# Patient Record
Sex: Female | Born: 1960 | Race: Black or African American | Hispanic: No | Marital: Married | Smoking: Former smoker
Health system: Southern US, Community
[De-identification: ages and names within clinical notes are randomized; demographics above are authoritative.]

## PROBLEM LIST (undated history)

## (undated) DIAGNOSIS — Z8781 Personal history of (healed) traumatic fracture: Secondary | ICD-10-CM

## (undated) DIAGNOSIS — R079 Chest pain, unspecified: Secondary | ICD-10-CM

## (undated) DIAGNOSIS — I499 Cardiac arrhythmia, unspecified: Secondary | ICD-10-CM

## (undated) DIAGNOSIS — H9313 Tinnitus, bilateral: Secondary | ICD-10-CM

## (undated) DIAGNOSIS — N63 Unspecified lump in unspecified breast: Secondary | ICD-10-CM

## (undated) HISTORY — DX: Tinnitus, bilateral: H93.13

## (undated) HISTORY — PX: BUNIONECTOMY: SHX129

## (undated) HISTORY — DX: Personal history of (healed) traumatic fracture: Z87.81

## (undated) HISTORY — DX: Chest pain, unspecified: R07.9

## (undated) HISTORY — DX: Unspecified lump in unspecified breast: N63.0

## (undated) HISTORY — DX: Cardiac arrhythmia, unspecified: I49.9

---

## 1998-12-29 ENCOUNTER — Encounter: Payer: Self-pay | Admitting: *Deleted

## 1998-12-29 ENCOUNTER — Ambulatory Visit (HOSPITAL_COMMUNITY): Admission: RE | Admit: 1998-12-29 | Discharge: 1998-12-29 | Payer: Self-pay | Admitting: *Deleted

## 1999-01-01 ENCOUNTER — Ambulatory Visit (HOSPITAL_COMMUNITY): Admission: RE | Admit: 1999-01-01 | Discharge: 1999-01-01 | Payer: Self-pay | Admitting: *Deleted

## 1999-01-01 ENCOUNTER — Encounter: Payer: Self-pay | Admitting: *Deleted

## 2000-08-01 ENCOUNTER — Other Ambulatory Visit: Admission: RE | Admit: 2000-08-01 | Discharge: 2000-08-01 | Payer: Self-pay | Admitting: Obstetrics and Gynecology

## 2000-08-16 ENCOUNTER — Encounter: Payer: Self-pay | Admitting: Obstetrics and Gynecology

## 2000-08-16 ENCOUNTER — Encounter: Admission: RE | Admit: 2000-08-16 | Discharge: 2000-08-16 | Payer: Self-pay | Admitting: Obstetrics and Gynecology

## 2001-03-21 HISTORY — PX: ANKLE SURGERY: SHX546

## 2001-10-11 ENCOUNTER — Other Ambulatory Visit: Admission: RE | Admit: 2001-10-11 | Discharge: 2001-10-11 | Payer: Self-pay | Admitting: Obstetrics and Gynecology

## 2001-12-04 ENCOUNTER — Encounter: Payer: Self-pay | Admitting: Obstetrics and Gynecology

## 2001-12-04 ENCOUNTER — Encounter: Admission: RE | Admit: 2001-12-04 | Discharge: 2001-12-04 | Payer: Self-pay | Admitting: Obstetrics and Gynecology

## 2002-05-18 ENCOUNTER — Encounter: Payer: Self-pay | Admitting: Emergency Medicine

## 2002-05-18 ENCOUNTER — Emergency Department (HOSPITAL_COMMUNITY): Admission: EM | Admit: 2002-05-18 | Discharge: 2002-05-18 | Payer: Self-pay | Admitting: Emergency Medicine

## 2002-05-23 ENCOUNTER — Ambulatory Visit (HOSPITAL_COMMUNITY): Admission: RE | Admit: 2002-05-23 | Discharge: 2002-05-24 | Payer: Self-pay | Admitting: Orthopedic Surgery

## 2003-02-17 ENCOUNTER — Encounter: Admission: RE | Admit: 2003-02-17 | Discharge: 2003-02-17 | Payer: Self-pay | Admitting: Obstetrics and Gynecology

## 2004-05-19 ENCOUNTER — Encounter: Admission: RE | Admit: 2004-05-19 | Discharge: 2004-05-19 | Payer: Self-pay | Admitting: Obstetrics and Gynecology

## 2004-07-28 ENCOUNTER — Other Ambulatory Visit: Admission: RE | Admit: 2004-07-28 | Discharge: 2004-07-28 | Payer: Self-pay | Admitting: Obstetrics and Gynecology

## 2004-08-11 ENCOUNTER — Encounter: Admission: RE | Admit: 2004-08-11 | Discharge: 2004-08-11 | Payer: Self-pay | Admitting: Obstetrics and Gynecology

## 2005-09-22 ENCOUNTER — Other Ambulatory Visit: Admission: RE | Admit: 2005-09-22 | Discharge: 2005-09-22 | Payer: Self-pay | Admitting: Obstetrics and Gynecology

## 2005-09-29 ENCOUNTER — Encounter: Admission: RE | Admit: 2005-09-29 | Discharge: 2005-09-29 | Payer: Self-pay | Admitting: Obstetrics and Gynecology

## 2006-02-03 ENCOUNTER — Encounter: Admission: RE | Admit: 2006-02-03 | Discharge: 2006-02-03 | Payer: Self-pay | Admitting: Obstetrics and Gynecology

## 2006-10-24 ENCOUNTER — Encounter: Admission: RE | Admit: 2006-10-24 | Discharge: 2006-10-24 | Payer: Self-pay | Admitting: Obstetrics and Gynecology

## 2007-11-21 ENCOUNTER — Encounter: Admission: RE | Admit: 2007-11-21 | Discharge: 2007-11-21 | Payer: Self-pay | Admitting: Obstetrics and Gynecology

## 2008-12-23 ENCOUNTER — Encounter: Admission: RE | Admit: 2008-12-23 | Discharge: 2008-12-23 | Payer: Self-pay | Admitting: Obstetrics and Gynecology

## 2010-02-12 ENCOUNTER — Encounter: Admission: RE | Admit: 2010-02-12 | Discharge: 2010-02-12 | Payer: Self-pay | Admitting: Obstetrics and Gynecology

## 2010-04-12 ENCOUNTER — Encounter: Payer: Self-pay | Admitting: Obstetrics and Gynecology

## 2010-08-06 NOTE — Op Note (Signed)
[name], Kristen Castaneda                           ACCOUNT NO.:  [id number]   MEDICAL RECORD NO.:  [id number]                   PATIENT TYPE:  OIB   LOCATION:  5013                                 FACILITY:  [facility]   PHYSICIAN:  Nadara Mustard, M.D.                DATE OF BIRTH:  11/05/1960   DATE OF PROCEDURE:  05/23/2002  DATE OF DISCHARGE:                                 OPERATIVE REPORT   PREOPERATIVE DIAGNOSIS:  Right ankle Weber B fibular fracture.   POSTOPERATIVE DIAGNOSIS:  Right ankle Weber B fibular fracture.   OPERATION PERFORMED:  Open reduction internal fixation right distal fibula.   SURGEON:  Nadara Mustard, M.D.   ANESTHESIA:  LMA.   ESTIMATED BLOOD LOSS:  Minimal.   ANTIBIOTICS:  1 g Kefzol.   TOURNIQUET TIME:  None.   DISPOSITION:  To PACU in stable condition, plan for 23-hour observation.   INDICATIONS FOR PROCEDURE:  The patient is a 50 year old woman who slipped  and fell on the ice and sustained a Weber B right fibular fracture  displaced.  The patient visited the office.  She did have a displacement of  the mortise and discussed the risks and benefits of closed versus surgical  treatment and the patient wished to proceed with surgical intervention. The  risks and benefits were discussed.  The patient states she understands and  wants to proceed at this time.   DESCRIPTION OF PROCEDURE:  The patient was brought to operating room #4 and  underwent a general LMA anesthetic.  After an adequate level of anesthesia  was obtained, the patient's right lower extremity was prepped using DuraPrep  and draped into a sterile field.  Collier Flowers was used to cover all exposed skin.  A lateral incision was made over the fibula.  This was carried down to the  fracture site.  Dissection was performed to cleanse the fracture site.  This  was reduced and a lag screw was used to stabilize the fracture site.  A  neutralization peg was then placed laterally and this was secured  with a six-  hole one third tubular locking plate.  C-arm fluoroscopy was used to verify  reduction in both AP and lateral planes.  Hard copy images were obtained and  this was reviewed and had good stable alignment.  The wound was irrigated.  The subcu was closed using 2-0 Vicryl.  The skin was closed using Proximate  staples.  The wound was covered with Adaptic orthopedic sponges, sterile  Webril and a Coban dressing.  The patient will be placed in a fracture boot  nonweightbearing, plan for 23 hour observation.  Discharge in the morning.  Nadara Mustard, M.D.   MVD/MEDQ  D:  05/23/2002  T:  05/23/2002  Job:  [id number]

## 2011-06-20 ENCOUNTER — Other Ambulatory Visit: Payer: Self-pay | Admitting: Obstetrics and Gynecology

## 2011-08-02 ENCOUNTER — Encounter: Payer: Self-pay | Admitting: Obstetrics and Gynecology

## 2012-05-03 ENCOUNTER — Ambulatory Visit: Payer: Self-pay | Admitting: Obstetrics and Gynecology

## 2012-05-03 ENCOUNTER — Encounter: Payer: Self-pay | Admitting: Obstetrics and Gynecology

## 2012-05-07 ENCOUNTER — Encounter: Payer: Self-pay | Admitting: Obstetrics and Gynecology

## 2012-05-07 ENCOUNTER — Ambulatory Visit: Payer: BC Managed Care – PPO | Admitting: Obstetrics and Gynecology

## 2012-05-07 VITALS — BP 114/70 | Resp 14 | Ht 62.0 in | Wt 163.0 lb

## 2012-05-07 DIAGNOSIS — Z309 Encounter for contraceptive management, unspecified: Secondary | ICD-10-CM

## 2012-05-07 DIAGNOSIS — Z124 Encounter for screening for malignant neoplasm of cervix: Secondary | ICD-10-CM

## 2012-05-07 DIAGNOSIS — Z01419 Encounter for gynecological examination (general) (routine) without abnormal findings: Secondary | ICD-10-CM

## 2012-05-07 LAB — POCT URINE PREGNANCY: Preg Test, Ur: NEGATIVE

## 2012-05-07 MED ORDER — NORETHIN ACE-ETH ESTRAD-FE 1-20 MG-MCG(24) PO TABS: 1.0000 | ORAL_TABLET | Freq: Every day | ORAL | Status: AC

## 2012-05-07 NOTE — Progress Notes (Signed)
Subjective:    Kristen Castaneda is a 52 y.o. female, G5P0, who presents for an annual exam. The patient reports no complaints.  Menstrual cycle:   LMP: Patient's last menstrual period was 02/05/2012.             Review of Systems Pertinent items are noted in HPI. Denies pelvic pain, urinary tract symptoms, vaginitis symptoms, irregular bleeding, menopausal symptoms, change in bowel habits or rectal bleeding   Objective:    BP 114/70  Resp 14  Ht 5\' 2"  (1.575 m)  LMP 02/05/2012   Wt Readings from Last 1 Encounters:  No data found for Wt   There is no weight on file to calculate BMI. General Appearance: Alert, no acute distress HEENT: Grossly normal Neck / Thyroid: Supple, no thyromegaly or cervical adenopathy Lungs: Clear to auscultation bilaterally Back: No CVA tenderness Breast Exam: No masses or nodes.No dimpling, nipple retraction or discharge. Cardiovascular: Regular rate and rhythm.  Gastrointestinal: Soft, non-tender, no masses or organomegaly Pelvic Exam: EGBUS-wnl, vagina-normal rugae, cervix- without lesions or tenderness, uterus appears normal size shape and consistency, adnexae-no masses or tenderness Rectovaginal: no masses and normal sphincter tone Lymphatic Exam: Non-palpable nodes in neck, clavicular,  axillary, or inguinal regions  Skin: no rashes or abnormalities Extremities: no clubbing cyanosis or edema  Neurologic: grossly normal Psychiatric: Alert and oriented  UPT: negative   Assessment:   Routine GYN Exam   Plan:  Lo Loestrin 24 Fe  #1 1 po qd 11 refills PAP sent RTO 1 year or prn  Ashunti Schofield,ELMIRAPA-C

## 2012-05-07 NOTE — Progress Notes (Signed)
Regular Periods: no Mammogram: yes  Monthly Breast Ex.: yes Exercise: no  Tetanus < 10 years: yes Seatbelts: yes  NI. Bladder Functn.: yes Abuse at home: no  Daily BM's: yes Stressful Work: yes  Healthy Diet: yes Sigmoid-Colonoscopy: 2012 ?  Calcium: no Medical problems this year: none   LAST PAP:2/13  Contraception: loestrin 24 fe  Mammogram:  2011  PCP: Dr. Nehemiah Settle  PMH: no change  FMH:  No change   Last Bone Scan: no  Pt is married.

## 2012-05-08 LAB — PAP IG W/ RFLX HPV ASCU

## 2012-11-08 ENCOUNTER — Other Ambulatory Visit: Payer: Self-pay

## 2012-11-08 DIAGNOSIS — Z1231 Encounter for screening mammogram for malignant neoplasm of breast: Secondary | ICD-10-CM

## 2012-11-12 ENCOUNTER — Ambulatory Visit
Admission: RE | Admit: 2012-11-12 | Discharge: 2012-11-12 | Disposition: A | Discharge status: Home / Self Care | Payer: BC Managed Care – PPO | Source: Ambulatory Visit

## 2012-11-12 DIAGNOSIS — Z1231 Encounter for screening mammogram for malignant neoplasm of breast: Secondary | ICD-10-CM

## 2012-11-16 ENCOUNTER — Other Ambulatory Visit: Payer: Self-pay | Admitting: Internal Medicine

## 2012-11-16 DIAGNOSIS — R209 Unspecified disturbances of skin sensation: Secondary | ICD-10-CM

## 2012-11-21 ENCOUNTER — Ambulatory Visit
Admission: RE | Admit: 2012-11-21 | Discharge: 2012-11-21 | Disposition: A | Discharge status: Home / Self Care | Payer: BC Managed Care – PPO | Source: Ambulatory Visit | Attending: Internal Medicine | Admitting: Internal Medicine

## 2012-11-21 DIAGNOSIS — R209 Unspecified disturbances of skin sensation: Secondary | ICD-10-CM

## 2012-12-04 ENCOUNTER — Encounter: Payer: Self-pay | Admitting: Neurology

## 2012-12-05 ENCOUNTER — Encounter: Payer: Self-pay | Admitting: Neurology

## 2012-12-05 ENCOUNTER — Ambulatory Visit (INDEPENDENT_AMBULATORY_CARE_PROVIDER_SITE_OTHER): Payer: BC Managed Care – PPO | Admitting: Neurology

## 2012-12-05 VITALS — BP 123/78 | HR 79 | Ht 62.0 in | Wt 163.0 lb

## 2012-12-05 DIAGNOSIS — R200 Anesthesia of skin: Secondary | ICD-10-CM

## 2012-12-05 DIAGNOSIS — R209 Unspecified disturbances of skin sensation: Secondary | ICD-10-CM

## 2012-12-05 NOTE — Progress Notes (Signed)
[facility]  PATIENT: Kristen Castaneda DOB: [birth year]  HISTORICAL  Mrs. Kristen Castaneda is a 52 yo RH AAF, was referred by her primary care physician Dr. Nehemiah Castaneda for evaluation of left skull numbness  She was previously healthy, since July fourth, she noticed left skull area numbness, only noticeable when she touch her skull, involving overlapping areas between left V1 and left great occipital nerve territory, she denies pain, no headaches, no rash broke out, it went away since September 10th 2014.  She denies visual change, no dysarthria, no dysphagia,  She reported normal laboratory evaluation,   MRI of the brain in September third 2014, reviewed together with patient, there was only few scattered subcortical small vessel disease, does not consistent with multiple sclerosis.   REVIEW OF SYSTEMS: Full 14 system review of systems performed and notable only for ringings in ears, flushing, numbness ALLERGIES: Allergies  Allergen Reactions  . Penicillins     HOME MEDICATIONS: Outpatient Prescriptions Prior to Visit  Medication Sig Dispense Refill  . diphenhydrAMINE (BENADRYL ALLERGY) 25 MG tablet Take 25 mg by mouth every 6 (six) hours as needed for itching.      . Multiple Vitamins-Minerals (MULTIVITAMIN PO) Take by mouth daily.      . Norethindrone Acetate-Ethinyl Estrad-FE (LOESTRIN 24 FE) 1-20 MG-MCG(24) tablet Take 1 tablet by mouth daily.  4 Package  0  . pseudoephedrine (SUDAFED) 30 MG tablet Take 30 mg by mouth every 4 (four) hours as needed for congestion.       No facility-administered medications prior to visit.    PAST MEDICAL HISTORY: Past Medical History  Diagnosis Date  . Breast nodule, history of right breast   . Chest pain   . Arrhythmia, palpitation, tired   . Tinnitus of both ears   . H/O fracture of ankle     PAST SURGICAL HISTORY: Past Surgical History  Procedure Laterality Date  . Cesarean section x3    . Bunionectomy Bilateral 1996 & 1998    . Ankle surgery Right 2003    FAMILY HISTORY: Family History  Problem Relation Age of Onset  . Hypertension Mother   . Lupus Mother   . Osteoporosis Mother   . Cancer Colon Father   . Lupus Sister   . Lupus Daughter   . Osteoporosis Daughter   . Diabetes Maternal Aunt   . Lupus Maternal Aunt   . Heart disease Maternal Grandmother   . Heart attack Maternal Grandmother   . Cancer Maternal Grandfather   . Breast cancer Maternal Aunt     SOCIAL HISTORY:  History   Social History  . Marital Status: Married    Spouse Name: Kristen Castaneda    Number of Children: 3  . Years of Education: college   Occupational History  . 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 Smoking status: Former Smoker    Types: Cigarettes  . Smokeless tobacco: Never Used     Comment: Quit 20 years ago.  . Alcohol Use: 0.6 oz/week    1 Glasses of wine per week     Comment: OCC.  . Drug Use: No  . Sexual Activity: Yes    Birth Control/ Protection: Pill     Comment: loestrin 25    Social History Narrative   Patient lives at home with husband Kristen Castaneda). Patient works for [employer] She is the Research officer, political party.    Patient has college education .   Right handed.  Caffeine- one cup daily   PHYSICAL EXAM  Filed Vitals:   12/05/12 0757  BP: 123/78  Pulse: 79  Height: 5\' 2"  (1.575 m)  Weight: 163 lb (73.936 kg)   Body mass index is 29.81 kg/(m^2).   Generalized: In no acute distress  Neck: Supple, no carotid bruits   Cardiac: Regular rate rhythm  Pulmonary: Clear to auscultation bilaterally  Musculoskeletal: No deformity  Neurological examination  Mentation: Alert oriented to time, place, history taking, and causual conversation  Cranial nerve II-XII: Pupils were equal round reactive to light extraocular movements were full, visual field were full on confrontational test. facial sensation and strength were normal. hearing was intact to  finger rubbing bilaterally. Uvula tongue midline.  head turning and shoulder shrug and were normal and symmetric.Tongue protrusion into cheek strength was normal.  Motor: normal tone, bulk and strength.  Sensory: Intact to fine touch, pinprick, preserved vibratory sensation, and proprioception at toes.  Coordination: Normal finger to nose, heel-to-shin bilaterally there was no truncal ataxia  Gait: Rising up from seated position without assistance, normal stance, without trunk ataxia, moderate stride, good arm swing, smooth turning, able to perform tiptoe, and heel walking without difficulty.   Romberg signs: Negative  Deep tendon reflexes: Brachioradialis 2/2, biceps 2/2, triceps 2/2, patellar 2/2, Achilles 2/2, plantar responses were flexor bilaterally.   DIAGNOSTIC DATA (LABS, IMAGING, TESTING) - I reviewed patient records, labs, notes, testing and imaging myself where available.   ASSESSMENT AND PLAN   52 years old right-handed [place] American female, with history of left skull numbness, essentially normal neurological examination, the described numbness was in the overlapping territory between left V1, left great occipital nerve, unsure etiology, possibility including stress induced, shingles, migraine variant.  She is asymptomatic now. Only very mild small vessel disease on MRI of the brain,  She will return to clinic as needed.      Levert Feinstein, M.D. Ph.D.  [facility] [address], [address] [place], [place] [address] [phone]

## 2014-01-20 ENCOUNTER — Encounter: Payer: Self-pay | Admitting: Neurology

## 2014-09-03 ENCOUNTER — Ambulatory Visit
Admission: RE | Admit: 2014-09-03 | Discharge: 2014-09-03 | Disposition: A | Discharge status: Home / Self Care | Payer: Managed Care, Other (non HMO) | Source: Ambulatory Visit | Attending: Obstetrics and Gynecology | Admitting: Obstetrics and Gynecology

## 2014-09-03 ENCOUNTER — Other Ambulatory Visit: Payer: Self-pay | Admitting: Obstetrics and Gynecology

## 2014-09-03 DIAGNOSIS — R059 Cough, unspecified: Secondary | ICD-10-CM

## 2014-09-03 DIAGNOSIS — R05 Cough: Secondary | ICD-10-CM

## 2019-06-14 ENCOUNTER — Other Ambulatory Visit: Payer: Self-pay | Admitting: Obstetrics and Gynecology

## 2019-06-14 DIAGNOSIS — Z1231 Encounter for screening mammogram for malignant neoplasm of breast: Secondary | ICD-10-CM

## 2019-07-16 ENCOUNTER — Other Ambulatory Visit: Payer: Self-pay

## 2019-07-16 ENCOUNTER — Ambulatory Visit
Admission: RE | Admit: 2019-07-16 | Discharge: 2019-07-16 | Disposition: A | Discharge status: Home / Self Care | Payer: 59 | Source: Ambulatory Visit | Attending: Obstetrics and Gynecology | Admitting: Obstetrics and Gynecology

## 2019-07-16 ENCOUNTER — Other Ambulatory Visit: Payer: Self-pay | Admitting: Internal Medicine

## 2019-07-16 DIAGNOSIS — R911 Solitary pulmonary nodule: Secondary | ICD-10-CM

## 2019-07-16 DIAGNOSIS — Z1231 Encounter for screening mammogram for malignant neoplasm of breast: Secondary | ICD-10-CM

## 2019-08-08 ENCOUNTER — Ambulatory Visit
Admission: RE | Admit: 2019-08-08 | Discharge: 2019-08-08 | Disposition: A | Discharge status: Home / Self Care | Payer: 59 | Source: Ambulatory Visit | Attending: Internal Medicine | Admitting: Internal Medicine

## 2019-08-08 DIAGNOSIS — R911 Solitary pulmonary nodule: Secondary | ICD-10-CM

## 2020-05-11 ENCOUNTER — Other Ambulatory Visit: Payer: Self-pay | Admitting: Obstetrics and Gynecology

## 2020-05-11 DIAGNOSIS — Z1231 Encounter for screening mammogram for malignant neoplasm of breast: Secondary | ICD-10-CM

## 2020-07-16 ENCOUNTER — Ambulatory Visit: Payer: 59

## 2020-07-16 ENCOUNTER — Ambulatory Visit
Admission: RE | Admit: 2020-07-16 | Discharge: 2020-07-16 | Disposition: A | Discharge status: Home / Self Care | Payer: 59 | Source: Ambulatory Visit | Attending: Obstetrics and Gynecology | Admitting: Obstetrics and Gynecology

## 2020-07-16 ENCOUNTER — Other Ambulatory Visit: Payer: Self-pay

## 2020-07-16 DIAGNOSIS — Z1231 Encounter for screening mammogram for malignant neoplasm of breast: Secondary | ICD-10-CM

## 2020-10-16 IMAGING — CT CT CHEST W/O CM
2 of 4 series · 12 of 36 positions shown, 15 images · non-contrast
Comparison: Chest radiographs 09/03/2014 and earlier.

Report of 08/12/2016 outside chest CT (no images available despite
request).

CLINICAL DATA: 58-year-old female with history of lung nodule on
outside [facility] [place] CT.

EXAM:
CT CHEST WITHOUT CONTRAST
TECHNIQUE: Multidetector CT imaging of the chest was performed following the
standard protocol without IV contrast.

[Series 2: chest 2.00 br40 s3 · axial · 0.65mm/px · z∈[+1470,+1704]mm · 9 of 139 slices shown, 12 images (1 of 2)]
[im 11/139  mediastinal]
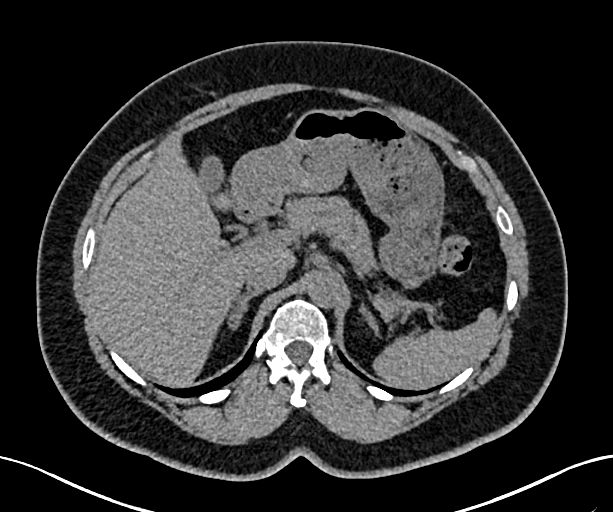
[im 11/139  lung]
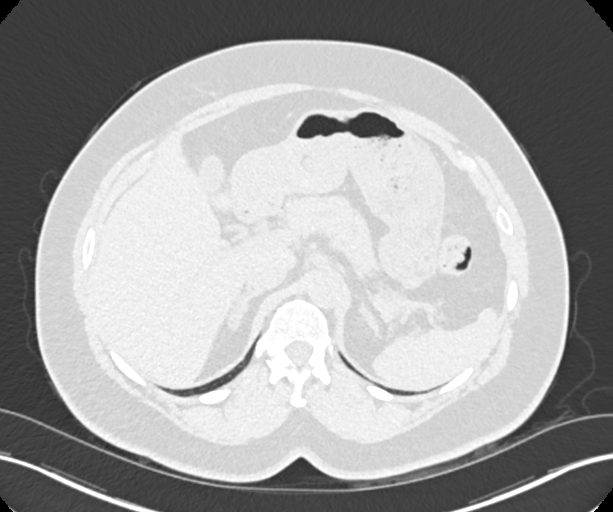
[im 32/139  lung]
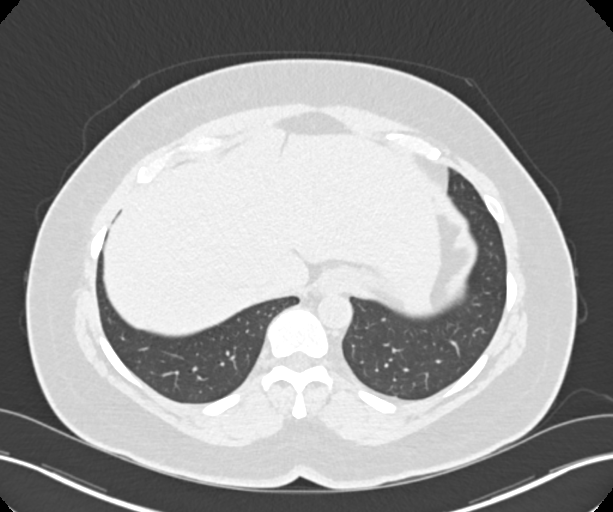
[im 43/139  lung]
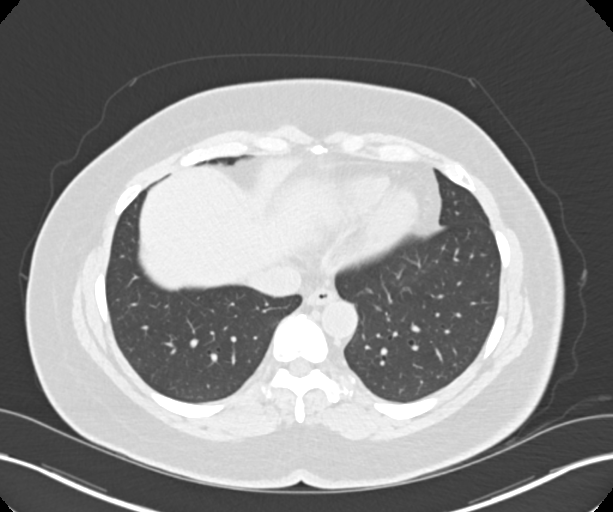
[im 54/139  lung]
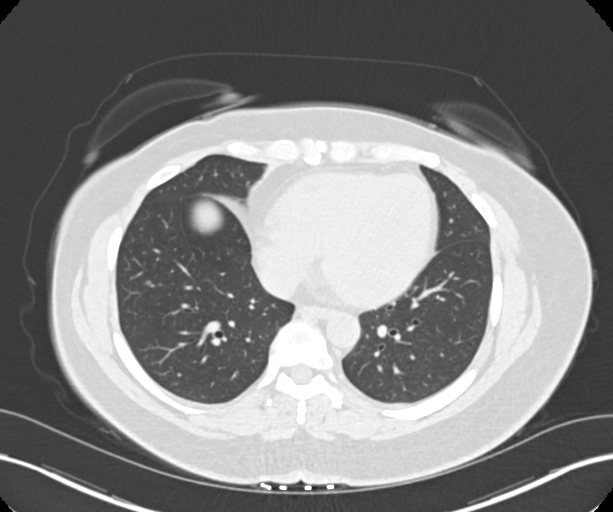
[im 75/139  mediastinal]
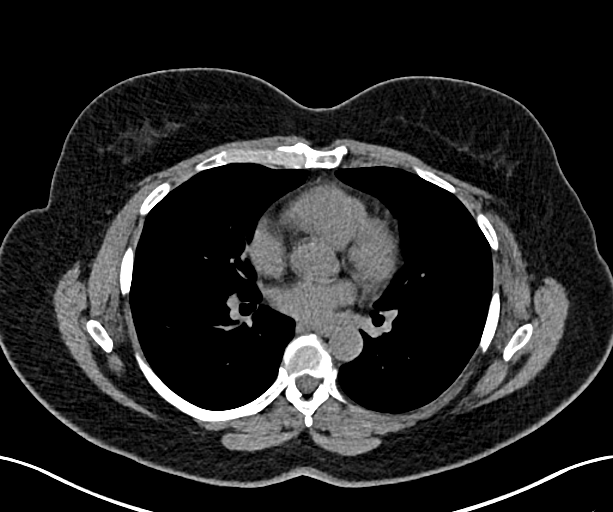
[im 75/139  lung]
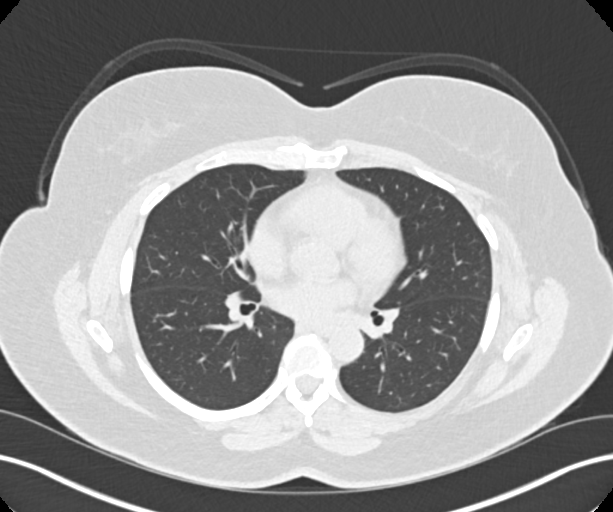
[im 85/139  lung]
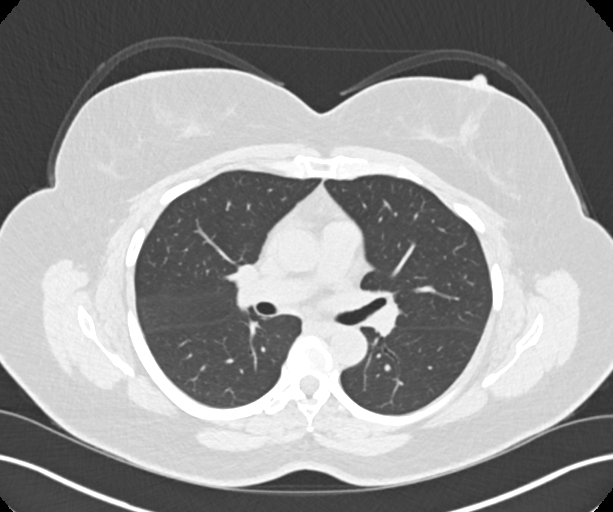
[im 96/139  lung]
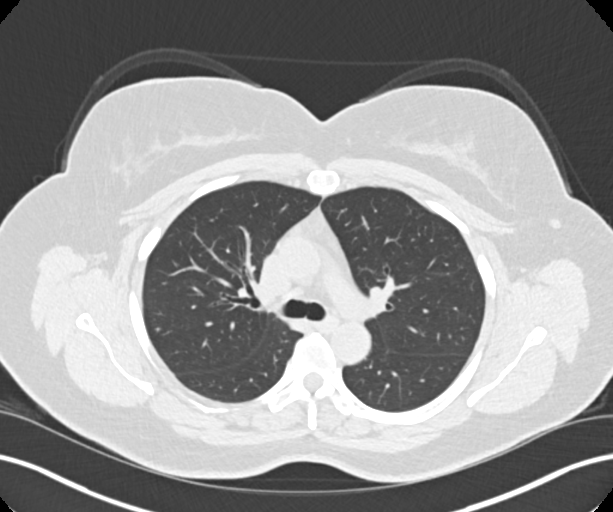
[im 117/139  lung]
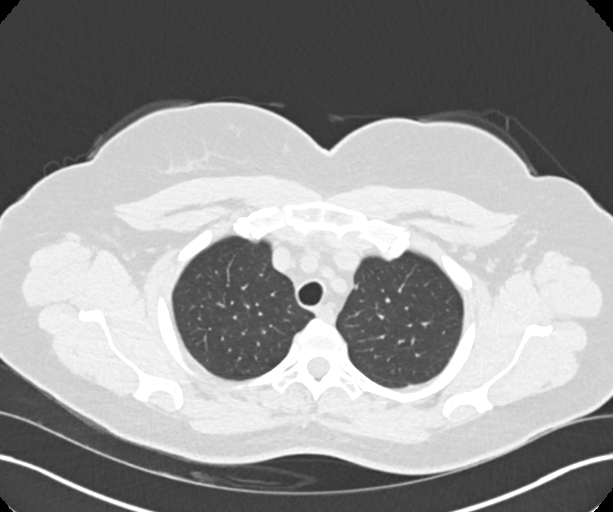
[im 128/139  mediastinal]
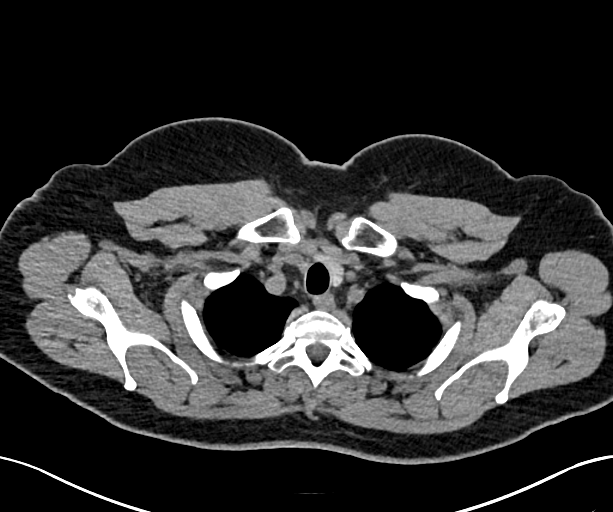
[im 128/139  lung]
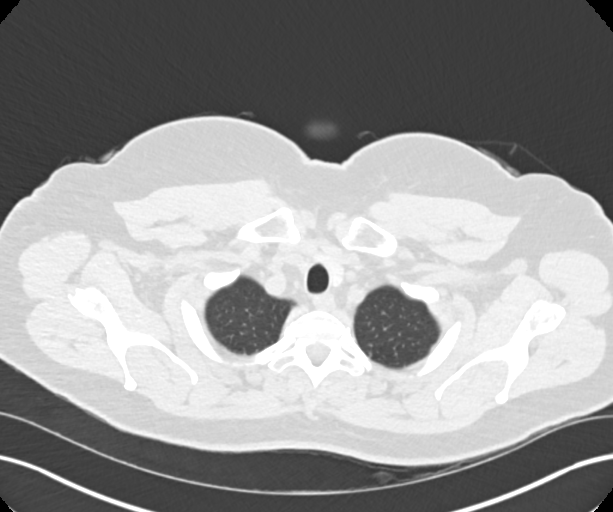

[Series 4: chest 2.00 br40 s3 · coronal · 0.55mm/px · 3 of 166 slices shown (2 of 2)]
[im 34/166  lung]
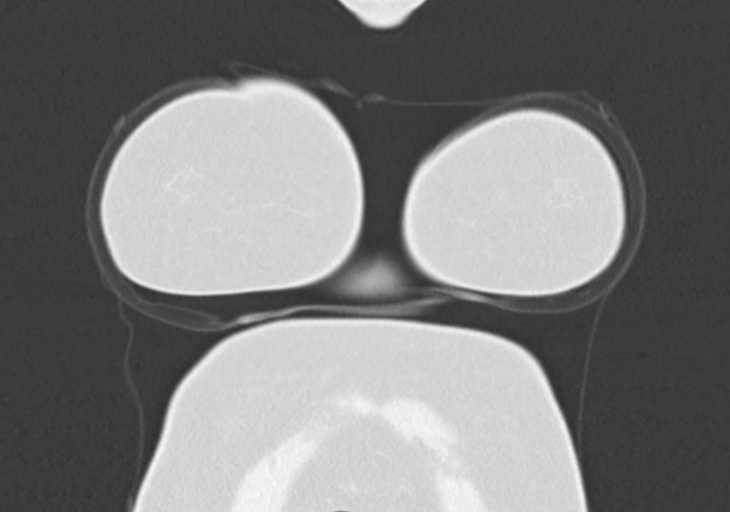
[im 67/166  lung]
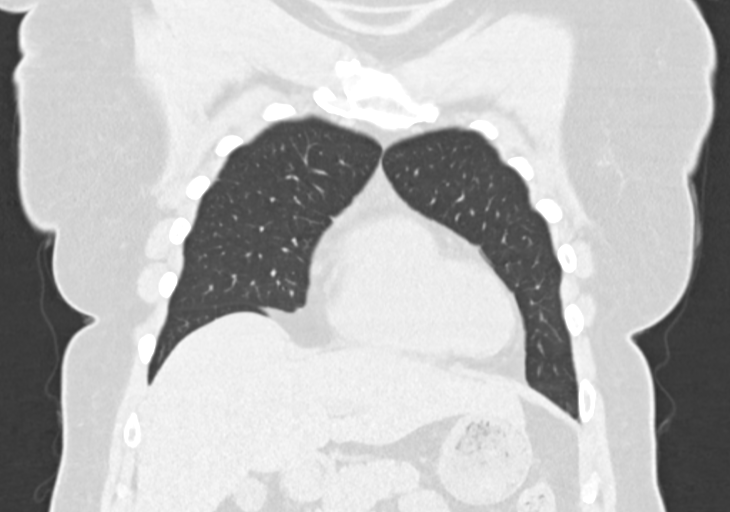
[im 100/166  lung]
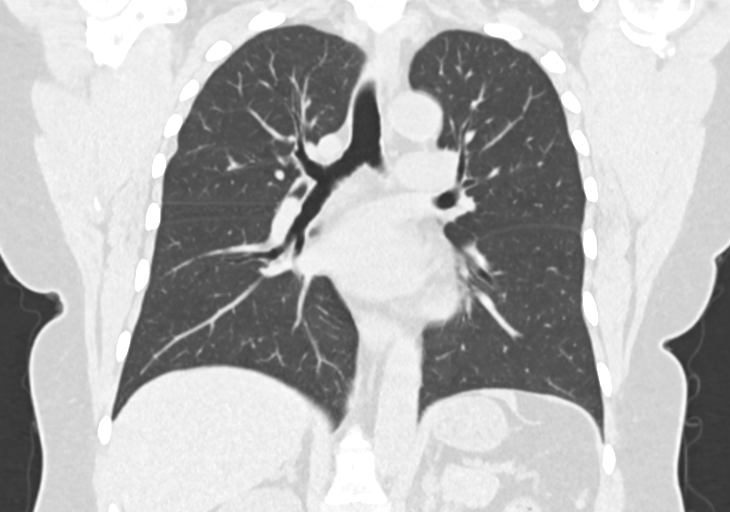

[12 of 36 positions shown; findings below may reference images not displayed]

FINDINGS: Cardiovascular: No calcified coronary artery a or visible aortic
atherosclerosis is evident. No cardiomegaly or pericardial effusion.
Vascular patency is not evaluated in the absence of IV contrast.

Mediastinum/Nodes: Negative.  No lymphadenopathy.

Lungs/Pleura: Major airways are patent. Lung volumes appear normal.
No pleural effusion.

There are several clustered noncalcified pulmonary nodules in the
right middle lobe up to 4 mm (series 8, image 76). There are
occasional tiny peribronchial nodules in the right upper lobe, the
largest is on series 8, image 18, 2-3 mm. Similar left apical lung
nodule on series 8, image 15. Mild nodularity in the lingula along
the lateral aspect of the left major fissure. Multiple small 2-3 mm
right lower lobe lung nodules, the largest on series 8, image 87.
the largest left lower lobe lung nodule is 4 mm on series 8, image
83.

Upper Abdomen: Small 12 mm round low-density area in the visible
left hepatic lobe with simple fluid density. By report this is
stable or smaller since 5023. Negative visible other noncontrast
liver parenchyma, gallbladder, spleen, pancreas, adrenal glands,
right kidney, bowel.

Musculoskeletal: No acute or suspicious osseous lesion.
IMPRESSION: 1. There are a number of small bilateral lung nodules, most smaller
than 4 mm.
Although direct comparison to the prior CT is not available, by
report these are similar and considering the three year imaging
interval these are strongly suspected to be benign postinflammatory
nodules.

2. Otherwise negative noncontrast chest CT.

3. Stable and benign small cyst in the left hepatic lobe.

## 2021-06-16 ENCOUNTER — Other Ambulatory Visit: Payer: Self-pay | Admitting: Obstetrics and Gynecology

## 2021-06-16 DIAGNOSIS — Z1231 Encounter for screening mammogram for malignant neoplasm of breast: Secondary | ICD-10-CM

## 2021-07-02 ENCOUNTER — Other Ambulatory Visit: Payer: Self-pay | Admitting: Internal Medicine

## 2021-07-02 ENCOUNTER — Ambulatory Visit
Admission: RE | Admit: 2021-07-02 | Discharge: 2021-07-02 | Disposition: A | Discharge status: Home / Self Care | Payer: 59 | Source: Ambulatory Visit | Attending: Internal Medicine | Admitting: Internal Medicine

## 2021-07-02 DIAGNOSIS — M545 Low back pain, unspecified: Secondary | ICD-10-CM

## 2021-07-20 ENCOUNTER — Ambulatory Visit
Admission: RE | Admit: 2021-07-20 | Discharge: 2021-07-20 | Disposition: A | Discharge status: Home / Self Care | Payer: 59 | Source: Ambulatory Visit | Attending: Obstetrics and Gynecology | Admitting: Obstetrics and Gynecology

## 2021-07-20 ENCOUNTER — Encounter: Payer: Self-pay | Admitting: Radiology

## 2021-07-20 DIAGNOSIS — Z1231 Encounter for screening mammogram for malignant neoplasm of breast: Secondary | ICD-10-CM

## 2022-05-09 ENCOUNTER — Other Ambulatory Visit: Payer: Self-pay | Admitting: Obstetrics and Gynecology

## 2022-05-09 DIAGNOSIS — Z1231 Encounter for screening mammogram for malignant neoplasm of breast: Secondary | ICD-10-CM

## 2022-05-11 ENCOUNTER — Ambulatory Visit: Payer: Self-pay

## 2022-05-11 ENCOUNTER — Encounter: Payer: Self-pay | Admitting: Physician Assistant

## 2022-05-11 ENCOUNTER — Ambulatory Visit (INDEPENDENT_AMBULATORY_CARE_PROVIDER_SITE_OTHER): Payer: 59 | Admitting: Physician Assistant

## 2022-05-11 VITALS — Ht 62.5 in | Wt 174.0 lb

## 2022-05-11 DIAGNOSIS — G8929 Other chronic pain: Secondary | ICD-10-CM | POA: Diagnosis not present

## 2022-05-11 DIAGNOSIS — M25561 Pain in right knee: Secondary | ICD-10-CM

## 2022-05-11 DIAGNOSIS — L989 Disorder of the skin and subcutaneous tissue, unspecified: Secondary | ICD-10-CM | POA: Diagnosis not present

## 2022-05-11 NOTE — Progress Notes (Signed)
Office Visit Note   Patient: Kristen Castaneda           Date of Birth: Mar 26, 1960           MRN: [id number] Visit Date: 05/11/2022              Requested by: Seward Carol, MD [address] [place],   [address] PCP: Seward Carol, MD  Chief Complaint  Patient presents with   Right Knee - Pain      HPI: Ms. Kristen Castaneda is a pleasant 62 year old woman who presents for evaluation of a skin lesion on her anterior knee.  She denies any injuries.  She really does not have much knee pain and just bothers her a little bit when she bumps it.  She said she has had the discoloration and lesion for a while.  About 7 years ago she did go to [facility] dermatology and a biopsy was done which just demonstrated dry skin.  She comes in today because while the discoloration remains the same she is starting to have it be more raised above the skin.  Assessment & Plan: Visit Diagnoses:  1. Chronic pain of right knee   2. Skin lesion     Plan: I reviewed the x-rays with Dr. Marlou Sa in the presentation.  From the orthopedic standpoint her knee is stable no evidence of any x-ray abnormalities.  We do recommend that she recheck with a dermatology and we will put in a new referral today  Follow-Up Instructions: No follow-ups on file.   Ortho Exam  Patient is alert, oriented, no adenopathy, well-dressed, normal affect, normal respiratory effort. Examination of her right knee she has no effusion no redness no erythema.  She has full flexion extension she can sustain a straight leg raise with good strength as well as resisted flexion and extension.  She does have a 2 cm x 3 cm dark lesion over the tibial tubercle near the insertion of the patellar tendon.  There is no surrounding erythema or cellulitis.  It is somewhat raised but I cannot appreciate any fluid  Imaging: No results found. No images are attached to the encounter.  Labs: No results found for: "HGBA1C", "ESRSEDRATE", "CRP",  "LABURIC", "REPTSTATUS", "GRAMSTAIN", "CULT", "LABORGA"   No results found for: "ALBUMIN", "PREALBUMIN", "CBC"  No results found for: "MG" No results found for: "VD25OH"  No results found for: "PREALBUMIN"     No data to display           Body mass index is 31.32 kg/m.  Orders:  Orders Placed This Encounter  Procedures   XR KNEE 3 VIEW RIGHT   No orders of the defined types were placed in this encounter.    Procedures: No procedures performed  Clinical Data: No additional findings.  ROS:  All other systems negative, except as noted in the HPI. Review of Systems  Objective: Vital Signs: Ht 5' 2.5" (1.588 m)   Wt 174 lb (78.9 kg)   LMP 03/21/2014   BMI 31.32 kg/m   Specialty Comments:  No specialty comments available.  PMFS History: Patient Active Problem List   Diagnosis Date Noted   Numbness 12/05/2012   Past Medical History:  Diagnosis Date   Arrhythmia    h/o   Breast nodule    right breast h/o   Chest pain    non cardiac      h/o   H/O fracture of ankle    Tinnitus of both ears  Family History  Problem Relation Age of Onset   Hypertension Mother    Lupus Mother    Osteoporosis Mother    Hypertension Father    Cancer Father        colon   Diabetes Maternal Aunt    Lupus Maternal Aunt    Lupus Sister    Lupus Daughter    Osteoporosis Daughter    Heart disease Maternal Grandmother    Heart attack Maternal Grandmother    Cancer Maternal Grandfather    Breast cancer Maternal Aunt     Past Surgical History:  Procedure Laterality Date   ANKLE SURGERY Right 2003   BUNIONECTOMY Bilateral [place]     x3   Social History   Occupational History   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  Substance and Sexual  Activity   Alcohol use: Yes    Alcohol/week: 1.0 standard drink of alcohol    Types: 1 Glasses of wine per week    Comment: OCC.   Drug use: No   Sexual activity: Yes    Birth control/protection: Pill    Comment: loestrin 25

## 2022-07-26 ENCOUNTER — Other Ambulatory Visit (HOSPITAL_COMMUNITY)
Admission: RE | Admit: 2022-07-26 | Discharge: 2022-07-26 | Disposition: A | Discharge status: Home / Self Care | Payer: 59 | Source: Ambulatory Visit | Attending: Obstetrics and Gynecology | Admitting: Obstetrics and Gynecology

## 2022-07-26 ENCOUNTER — Other Ambulatory Visit: Payer: Self-pay | Admitting: Obstetrics and Gynecology

## 2022-07-26 ENCOUNTER — Ambulatory Visit
Admission: RE | Admit: 2022-07-26 | Discharge: 2022-07-26 | Disposition: A | Discharge status: Home / Self Care | Payer: 59 | Source: Ambulatory Visit | Attending: Obstetrics and Gynecology | Admitting: Obstetrics and Gynecology

## 2022-07-26 DIAGNOSIS — Z01419 Encounter for gynecological examination (general) (routine) without abnormal findings: Secondary | ICD-10-CM | POA: Diagnosis not present

## 2022-07-26 DIAGNOSIS — Z1231 Encounter for screening mammogram for malignant neoplasm of breast: Secondary | ICD-10-CM

## 2022-07-28 LAB — CYTOLOGY - PAP
Adequacy: ABSENT
Comment: NEGATIVE
Diagnosis: NEGATIVE
High risk HPV: NEGATIVE

## 2023-01-13 ENCOUNTER — Encounter: Payer: Self-pay | Admitting: Physician Assistant

## 2023-01-13 ENCOUNTER — Other Ambulatory Visit: Payer: Self-pay

## 2023-01-13 ENCOUNTER — Ambulatory Visit (INDEPENDENT_AMBULATORY_CARE_PROVIDER_SITE_OTHER): Payer: 59 | Admitting: Physician Assistant

## 2023-01-13 DIAGNOSIS — M542 Cervicalgia: Secondary | ICD-10-CM | POA: Insufficient documentation

## 2023-01-13 DIAGNOSIS — M79672 Pain in left foot: Secondary | ICD-10-CM

## 2023-01-13 DIAGNOSIS — M25572 Pain in left ankle and joints of left foot: Secondary | ICD-10-CM | POA: Insufficient documentation

## 2023-01-13 NOTE — Progress Notes (Signed)
Office Visit Note   Patient: Kristen Castaneda           Date of Birth: 30-Oct-1960           MRN: [id number] Visit Date: 01/13/2023              Requested by: Renford Dills, MD [address] [place],  [place] [address] PCP: Renford Dills, MD   Assessment & Plan: Visit Diagnoses:  1. Neck pain   2. Pain in left foot   3. Pain in left ankle and joints of left foot     Plan: Pleasant 62 year old woman who I have seen in the past.  Comes in today with a 2-week history of plantar left heel pain no particular injury bother her more when first getting out of bed feels better after she is walked for a while.  X-rays do demonstrate a small spur.  Findings and exam consistent with plantar fasciitis.  Talked about the natural history of this we discussed orthotics we discussed exercises have given her some of those today.  Also discussed getting into a regular stretching program she is going to try this. Also came in for right neck radiating into her trapezius pain.  This has been going on a month she says she feels popping in her neck.  She denies any paresthesias she has great strength she has no radicular findings.  X-rays do show some degenerative changes and some straightening of the lordotic curve.  Findings consistent with arthritis we discussed different options for this she does feel better when she gets a massage and will also try topical Voltaren  Follow-Up Instructions: 1 month  Orders:  Orders Placed This Encounter  Procedures   XR Cervical Spine 2 or 3 views   XR Foot Complete Left   No orders of the defined types were placed in this encounter.     Procedures: No procedures performed   Clinical Data: No additional findings.   Subjective: Chief Complaint  Patient presents with   Left Foot - Pain   Right Shoulder - Pain    HPI patient is a pleasant 62 year old woman who comes in for 2 complaints.  First she has a history of left foot pain x 2 weeks.  No  particular injury is on the bottom of her heel it gets better with walking more painful when she first arises in the morning.  She is also complaining of right shoulder pain and neck pain for about a month.  She is right-hand dominant.  She does not have any trouble with shoulder mobility.  She says her neck pops..  No injury  Review of Systems  All other systems reviewed and are negative.    Objective: Vital Signs: LMP 03/21/2014   Physical Exam Constitutional:      Appearance: Normal appearance.  Pulmonary:     Effort: Pulmonary effort is normal.  Skin:    General: Skin is warm and dry.  Neurological:     General: No focal deficit present.     Mental Status: She is alert and oriented to person, place, and time.     Ortho Exam Examination of her left foot she has excellent strength with dorsiflexion plantarflexion eversion inversion.  No redness no erythema she is neurologically intact.  She has a fairly flexible heel cord.  She does have focal pain over the plantar insertion of the plantar fascia Examination of her neck she has excellent range of motion with extension  flexion side-to-side turning.  No step-offs noted in her neck on palpation or crepitus.  She has 5 out of 5 strength with triceps biceps abductors and grip strength Specialty Comments:  No specialty comments available.  Imaging: No results found.   PMFS History: Patient Active Problem List   Diagnosis Date Noted   Neck pain 01/13/2023   Pain in left ankle and joints of left foot 01/13/2023   Numbness 12/05/2012   Past Medical History:  Diagnosis Date   Arrhythmia    h/o   Breast nodule    right breast h/o   Chest pain    non cardiac      h/o   H/O fracture of ankle    Tinnitus of both ears     Family History  Problem Relation Age of Onset   Hypertension Mother    Lupus Mother    Osteoporosis Mother    Hypertension Father    Cancer Father        colon   Diabetes Maternal Aunt    Lupus  Maternal Aunt    Lupus Sister    Lupus Daughter    Osteoporosis Daughter    Heart disease Maternal Grandmother    Heart attack Maternal Grandmother    Cancer Maternal Grandfather    Breast cancer Maternal Aunt     Past Surgical History:  Procedure Laterality Date   ANKLE SURGERY Right 2003   BUNIONECTOMY Bilateral 1996 & 1998   CESAREAN SECTION     x3   Social History   Occupational History   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  Substance and Sexual Activity   Alcohol use: Yes    Alcohol/week: 1.0 standard drink of alcohol    Types: 1 Glasses of wine per week    Comment: OCC.   Drug use: No   Sexual activity: Yes    Birth control/protection: Pill    Comment: loestrin 25

## 2023-08-04 ENCOUNTER — Other Ambulatory Visit: Payer: Self-pay | Admitting: Obstetrics and Gynecology

## 2023-08-04 DIAGNOSIS — Z1231 Encounter for screening mammogram for malignant neoplasm of breast: Secondary | ICD-10-CM

## 2023-08-29 ENCOUNTER — Ambulatory Visit
Admission: RE | Admit: 2023-08-29 | Discharge: 2023-08-29 | Disposition: A | Discharge status: Home / Self Care | Source: Ambulatory Visit | Attending: Obstetrics and Gynecology | Admitting: Obstetrics and Gynecology

## 2023-08-29 DIAGNOSIS — Z1231 Encounter for screening mammogram for malignant neoplasm of breast: Secondary | ICD-10-CM
